# Patient Record
Sex: Female | Born: 1985 | Race: Black or African American | Hispanic: No | Marital: Single | Smoking: Current some day smoker
Health system: Southern US, Community
[De-identification: ages and names within clinical notes are randomized; demographics above are authoritative.]

---

## 2005-04-18 ENCOUNTER — Inpatient Hospital Stay (HOSPITAL_COMMUNITY): Admission: AD | Admit: 2005-04-18 | Discharge: 2005-04-18 | Payer: Self-pay | Admitting: *Deleted

## 2005-04-20 ENCOUNTER — Inpatient Hospital Stay (HOSPITAL_COMMUNITY): Admission: AD | Admit: 2005-04-20 | Discharge: 2005-04-20 | Payer: Self-pay | Admitting: Obstetrics & Gynecology

## 2005-05-04 ENCOUNTER — Inpatient Hospital Stay (HOSPITAL_COMMUNITY): Admission: AD | Admit: 2005-05-04 | Discharge: 2005-05-04 | Payer: Self-pay | Admitting: Obstetrics & Gynecology

## 2008-02-06 ENCOUNTER — Emergency Department (HOSPITAL_COMMUNITY): Admission: EM | Admit: 2008-02-06 | Discharge: 2008-02-06 | Payer: Self-pay | Admitting: Emergency Medicine

## 2010-01-09 ENCOUNTER — Emergency Department (HOSPITAL_COMMUNITY)
Admission: EM | Admit: 2010-01-09 | Discharge: 2010-01-09 | Payer: Self-pay | Source: Home / Self Care | Admitting: Emergency Medicine

## 2010-05-02 ENCOUNTER — Emergency Department (HOSPITAL_COMMUNITY)
Admission: EM | Admit: 2010-05-02 | Discharge: 2010-05-02 | Disposition: A | Discharge status: Home / Self Care | Payer: Self-pay | Attending: Emergency Medicine | Admitting: Emergency Medicine

## 2010-05-02 DIAGNOSIS — R0609 Other forms of dyspnea: Secondary | ICD-10-CM | POA: Insufficient documentation

## 2010-05-02 DIAGNOSIS — R5381 Other malaise: Secondary | ICD-10-CM | POA: Insufficient documentation

## 2010-05-02 DIAGNOSIS — R002 Palpitations: Secondary | ICD-10-CM | POA: Insufficient documentation

## 2010-05-02 DIAGNOSIS — R0989 Other specified symptoms and signs involving the circulatory and respiratory systems: Secondary | ICD-10-CM | POA: Insufficient documentation

## 2010-05-02 DIAGNOSIS — R454 Irritability and anger: Secondary | ICD-10-CM | POA: Insufficient documentation

## 2010-05-02 DIAGNOSIS — H5789 Other specified disorders of eye and adnexa: Secondary | ICD-10-CM | POA: Insufficient documentation

## 2010-05-02 DIAGNOSIS — F411 Generalized anxiety disorder: Secondary | ICD-10-CM | POA: Insufficient documentation

## 2010-05-02 DIAGNOSIS — R110 Nausea: Secondary | ICD-10-CM | POA: Insufficient documentation

## 2010-05-02 DIAGNOSIS — R5383 Other fatigue: Secondary | ICD-10-CM | POA: Insufficient documentation

## 2010-05-02 LAB — CBC
Hemoglobin: 12 g/dL (ref 12.0–15.0)
MCH: 28.2 pg (ref 26.0–34.0)
MCV: 86.4 fL (ref 78.0–100.0)
RBC: 4.25 MIL/uL (ref 3.87–5.11)

## 2010-05-02 LAB — COMPREHENSIVE METABOLIC PANEL
BUN: 7 mg/dL (ref 6–23)
CO2: 26 mEq/L (ref 19–32)
Chloride: 108 mEq/L (ref 96–112)
Creatinine, Ser: 0.9 mg/dL (ref 0.4–1.2)
GFR calc non Af Amer: >60 mL/min (ref >60)
Glucose, Bld: 94 mg/dL (ref 70–99)
Total Bilirubin: 0.4 mg/dL (ref 0.3–1.2)

## 2010-05-02 LAB — URINE MICROSCOPIC-ADD ON

## 2010-05-02 LAB — DIFFERENTIAL
Lymphs Abs: 3.9 10*3/uL (ref 0.7–4.0)
Monocytes Absolute: 0.4 10*3/uL (ref 0.1–1.0)
Monocytes Relative: 5 % (ref 3–12)
Neutro Abs: 5.3 10*3/uL (ref 1.7–7.7)
Neutrophils Relative %: 54 % (ref 43–77)

## 2010-05-02 LAB — URINALYSIS, ROUTINE W REFLEX MICROSCOPIC
Leukocytes, UA: NEGATIVE
Protein, ur: 30 mg/dL — AB
Specific Gravity, Urine: 1.031 — ABNORMAL HIGH (ref 1.005–1.030)
Urine Glucose, Fasting: NEGATIVE mg/dL
Urobilinogen, UA: 1 mg/dL (ref 0.0–1.0)

## 2010-10-16 ENCOUNTER — Emergency Department (HOSPITAL_COMMUNITY)
Admission: EM | Admit: 2010-10-16 | Discharge: 2010-10-16 | Disposition: A | Discharge status: Home / Self Care | Payer: Self-pay | Attending: Emergency Medicine | Admitting: Emergency Medicine

## 2010-10-16 DIAGNOSIS — R229 Localized swelling, mass and lump, unspecified: Secondary | ICD-10-CM | POA: Insufficient documentation

## 2010-10-16 DIAGNOSIS — M79609 Pain in unspecified limb: Secondary | ICD-10-CM | POA: Insufficient documentation

## 2010-10-16 DIAGNOSIS — L03019 Cellulitis of unspecified finger: Secondary | ICD-10-CM | POA: Insufficient documentation

## 2012-03-25 ENCOUNTER — Encounter (HOSPITAL_COMMUNITY): Payer: Self-pay | Admitting: Emergency Medicine

## 2012-03-25 ENCOUNTER — Emergency Department (HOSPITAL_COMMUNITY)
Admission: EM | Admit: 2012-03-25 | Discharge: 2012-03-25 | Disposition: A | Discharge status: Home / Self Care | Payer: Self-pay | Attending: Emergency Medicine | Admitting: Emergency Medicine

## 2012-03-25 DIAGNOSIS — R1310 Dysphagia, unspecified: Secondary | ICD-10-CM | POA: Insufficient documentation

## 2012-03-25 DIAGNOSIS — R5383 Other fatigue: Secondary | ICD-10-CM | POA: Insufficient documentation

## 2012-03-25 DIAGNOSIS — K0889 Other specified disorders of teeth and supporting structures: Secondary | ICD-10-CM

## 2012-03-25 DIAGNOSIS — R630 Anorexia: Secondary | ICD-10-CM | POA: Insufficient documentation

## 2012-03-25 DIAGNOSIS — L519 Erythema multiforme, unspecified: Secondary | ICD-10-CM | POA: Insufficient documentation

## 2012-03-25 DIAGNOSIS — K1370 Unspecified lesions of oral mucosa: Secondary | ICD-10-CM | POA: Insufficient documentation

## 2012-03-25 DIAGNOSIS — R5381 Other malaise: Secondary | ICD-10-CM | POA: Insufficient documentation

## 2012-03-25 DIAGNOSIS — R51 Headache: Secondary | ICD-10-CM | POA: Insufficient documentation

## 2012-03-25 DIAGNOSIS — H9209 Otalgia, unspecified ear: Secondary | ICD-10-CM | POA: Insufficient documentation

## 2012-03-25 DIAGNOSIS — K089 Disorder of teeth and supporting structures, unspecified: Secondary | ICD-10-CM | POA: Insufficient documentation

## 2012-03-25 MED ORDER — OXYCODONE-ACETAMINOPHEN 5-325 MG PO TABS: 1.0000 | ORAL_TABLET | ORAL | Status: DC | PRN

## 2012-03-25 MED ORDER — PENICILLIN V POTASSIUM 500 MG PO TABS: 1000.0000 mg | ORAL_TABLET | Freq: Two times a day (BID) | ORAL | Status: DC

## 2012-03-25 MED ORDER — DOCUSATE SODIUM 100 MG PO CAPS
100.0000 mg | ORAL_CAPSULE | Freq: Once | ORAL | Status: DC
  Filled 2012-03-25 (×2): qty 1

## 2012-03-25 NOTE — ED Notes (Signed)
Pt c/o bilateral dental pain x several months worse over last couple of days and ear pain x several days

## 2012-03-25 NOTE — ED Notes (Signed)
C/O right lower molar pain x 2 weeks. Also c/o right ear pain and right jaw.

## 2012-03-25 NOTE — ED Notes (Signed)
Pt refused colace in ear

## 2012-03-28 NOTE — ED Provider Notes (Signed)
History     CSN: [id number]  Arrival date & time 03/25/12  0825   First MD Initiated Contact with Patient 03/25/12 0840      Chief Complaint  Patient presents with  . Dental Pain  . Otalgia    (Consider location/radiation/quality/duration/timing/severity/associated sxs/prior treatment) Patient is a 27 y.o. female presenting with tooth pain and ear pain. The history is provided by the patient. No language interpreter was used.  Dental PainThe primary symptoms include mouth pain and headaches. Primary symptoms do not include dental injury, oral bleeding, oral lesions, fever, shortness of breath, sore throat, angioedema or cough. The symptoms began 3 to 5 days ago. The symptoms are worsening. The symptoms are new.  Additional symptoms include: dental sensitivity to temperature, gum swelling, gum tenderness, pain with swallowing and ear pain. Additional symptoms do not include: purulent gums, trismus, jaw pain, facial swelling, trouble swallowing, excessive salivation, dry mouth, taste disturbance, smell disturbance, drooling, hearing loss, nosebleeds, swollen glands, goiter and fatigue. Medical issues do not include: alcohol problem, smoking, chewing tobacco, immunosuppression, periodontal disease and cancer. Associated medical issues comments: filling is missing.   Otalgia Associated symptoms include headaches. Pertinent negatives include no hearing loss, no sore throat, no abdominal pain, no diarrhea, no vomiting, no cough and no rash.    History reviewed. No pertinent past medical history.  History reviewed. No pertinent past surgical history.  History reviewed. No pertinent family history.  History  Substance Use Topics  . Smoking status: Never Smoker   . Smokeless tobacco: Not on file  . Alcohol Use: Yes    OB History    Grav Para Term Preterm Abortions TAB SAB Ect Mult Living                  Review of Systems  Constitutional: Positive for activity change and appetite  change. Negative for fever, chills and fatigue.  HENT: Positive for ear pain and dental problem. Negative for hearing loss, nosebleeds, sore throat, facial swelling, drooling and trouble swallowing.   Respiratory: Negative for cough and shortness of breath.   Cardiovascular: Negative.  Negative for chest pain.  Gastrointestinal: Negative.  Negative for nausea, vomiting, abdominal pain, diarrhea and constipation.  Genitourinary: Negative for dysuria and hematuria.  Musculoskeletal: Negative for myalgias and arthralgias.  Skin: Negative for rash.  Neurological: Positive for headaches. Negative for numbness.  Psychiatric/Behavioral: Negative.   All other systems reviewed and are negative.    Allergies  Review of patient's allergies indicates no known allergies.  Home Medications   Current Outpatient Rx  Name  Route  Sig  Dispense  Refill  . OXYCODONE-ACETAMINOPHEN 5-325 MG PO TABS   Oral   Take 1-2 tablets by mouth every 4 (four) hours as needed for pain.   10 tablet   0   . PENICILLIN V POTASSIUM 500 MG PO TABS   Oral   Take 2 tablets (1,000 mg total) by mouth 2 (two) times daily. X 7 days   28 tablet   0     BP 136/87  Pulse 77  Temp 98.3 F (36.8 C) (Oral)  Resp 18  SpO2 97%  Physical Exam  Constitutional: She is oriented to person, place, and time. She appears well-developed and well-nourished. No distress.  HENT:  Head: Normocephalic and atraumatic. No trismus in the jaw.  Mouth/Throat: Uvula is midline. No uvula swelling. No posterior oropharyngeal edema or posterior oropharyngeal erythema.         Back left molar with large  hole . Surrounding gingival erythema. No discharge.   Eyes: Conjunctivae normal are normal. No scleral icterus.  Neck: Normal range of motion.  Cardiovascular: Normal rate, regular rhythm and normal heart sounds.  Exam reveals no gallop and no friction rub.   No murmur heard. Pulmonary/Chest: Effort normal and breath sounds normal. No  respiratory distress.  Abdominal: Soft. Bowel sounds are normal. She exhibits no distension and no mass. There is no tenderness. There is no guarding.  Neurological: She is alert and oriented to person, place, and time.  Skin: Skin is warm and dry. She is not diaphoretic.    ED Course  Procedures (including critical care time)  Labs Reviewed - No data to display No results found.   1. Tooth pain       MDM  Patient with toothache.  No gross abscess.  Exam unconcerning for Ludwig's angina or spread of infection.  Will treat with penicillin and pain medicine.  Urged patient to follow-up with dentist.           Arthor Captain, PA-C 03/28/12 2207

## 2012-03-29 NOTE — ED Provider Notes (Signed)
Medical screening examination/treatment/procedure(s) were performed by non-physician practitioner and as supervising physician I was immediately available for consultation/collaboration.   Dessiree Sze L Rache Klimaszewski, MD 03/29/12 0147 

## 2013-02-03 ENCOUNTER — Emergency Department (HOSPITAL_COMMUNITY)
Admission: EM | Admit: 2013-02-03 | Discharge: 2013-02-03 | Disposition: A | Discharge status: Home / Self Care | Payer: Self-pay | Attending: Emergency Medicine | Admitting: Emergency Medicine

## 2013-02-03 ENCOUNTER — Encounter (HOSPITAL_COMMUNITY): Payer: Self-pay | Admitting: Emergency Medicine

## 2013-02-03 DIAGNOSIS — N61 Mastitis without abscess: Secondary | ICD-10-CM | POA: Insufficient documentation

## 2013-02-03 DIAGNOSIS — N611 Abscess of the breast and nipple: Secondary | ICD-10-CM

## 2013-02-03 MED ORDER — CEPHALEXIN 500 MG PO CAPS: 1000.0000 mg | ORAL_CAPSULE | Freq: Two times a day (BID) | ORAL | Status: DC

## 2013-02-03 MED ORDER — HYDROCODONE-ACETAMINOPHEN 5-325 MG PO TABS: 1.0000 | ORAL_TABLET | ORAL | Status: DC | PRN

## 2013-02-03 MED ORDER — SULFAMETHOXAZOLE-TRIMETHOPRIM 800-160 MG PO TABS: 1.0000 | ORAL_TABLET | Freq: Two times a day (BID) | ORAL | Status: DC

## 2013-02-03 NOTE — ED Provider Notes (Signed)
CSN: [id number]     Arrival date & time 02/03/13  2032 History  This chart was scribed for Ruby Cola, PA working with Richardean Canal, MD by Quintella Reichert, ED Scribe. This patient was seen in room WTR8/WTR8 and the patient's care was started at 9:41 PM.   Chief Complaint  Patient presents with  . Recurrent Skin Infections    The history is provided by the patient. No language interpreter was used.    HPI Comments: Jenna Hudson is a 27 y.o. female who presents to the Emergency Department complaining of a recurrent painful lump between her breasts that she first noticed 4 days ago.  Pt states that for some time she has had a "little bump" in that area which appears intermittently x >1 year and which seems to be relieved by taking a hot shower and resolves spontaneously.  Her present episode began 4 days ago and she states it is in the exact same location as her previous symptoms.  This is her 2nd episode this year although it has never been as persistent or painful as at present.  Her current episode has not been relieved by a hot shower or by Motrin.  She states the area "came to a head" but did not drain.  She also complains of constant moderate-to-severe pain to the area that is worsened by movement and palpation.  She denies fever, rash to any other area, discharge from nipples, or pain to any other area of breasts.  She denies chronic medical conditions or regular medication usage.  She denies personal or family h/o breast cancer.  She has not had a mammogram and has never been seen by an oncologist.   History reviewed. No pertinent past medical history.  History reviewed. No pertinent past surgical history.  History reviewed. No pertinent family history.   History  Substance Use Topics  . Smoking status: Never Smoker   . Smokeless tobacco: Not on file  . Alcohol Use: Yes    OB History   Grav Para Term Preterm Abortions TAB SAB Ect Mult Living                   Review of Systems  All other systems reviewed and are negative.     Allergies  Review of patient's allergies indicates no known allergies.  Home Medications   Current Outpatient Rx  Name  Route  Sig  Dispense  Refill  . ibuprofen (ADVIL,MOTRIN) 600 MG tablet   Oral   Take 600 mg by mouth every 6 (six) hours as needed.          BP 134/80  Pulse 82  Temp(Src) 98.1 F (36.7 C) (Oral)  Resp 16  Wt 280 lb (127.007 kg)  SpO2 100%  LMP 01/10/2013  Physical Exam  Nursing note and vitals reviewed. Constitutional: She is oriented to person, place, and time. She appears well-developed and well-nourished. No distress.  HENT:  Head: Normocephalic and atraumatic.  Eyes:  Normal appearance  Neck: Normal range of motion.  Pulmonary/Chest: Effort normal.  3cm induration and erythema, upper inner quadrant right breast.  Very ttp.  Visualized with bedside US and there is a subq fluid collection.  No tenderness of sternum.  Musculoskeletal: Normal range of motion.  Neurological: She is alert and oriented to person, place, and time.  Psychiatric: She has a normal mood and affect. Her behavior is normal.    ED Course  Procedures (including critical care time) INCISION AND DRAINAGE  Performed by: Ruby Cola E Consent: Verbal consent obtained. Risks and benefits: risks, benefits and alternatives were discussed Type: abscess  Body area: right breaset  Anesthesia: local infiltration  Incision was made with a scalpel.  Local anesthetic: lidocaine 2% w/ epinephrine  Anesthetic total: 4 ml  Complexity: complex Blunt dissection to break up loculations  Drainage: purulent  Drainage amount: large  Packing material: none  Patient tolerance: Patient tolerated the procedure well with no immediate complications.     DIAGNOSTIC STUDIES: Oxygen Saturation is 100% on room air, normal by my interpretation.    COORDINATION OF CARE: 9:47 PM-Discussed treatment  plan which includes bedside US with pt at bedside and pt agreed to plan.    Labs Review Labs Reviewed - No data to display  Imaging Review No results found.  EKG Interpretation   None       MDM   1. Abscess of breast, right    27yo healthy F presents w/ R breast abscess w/ minimal surrounding cellulitis.  I&D'd.  D/c'd home w/ abx d/t cellulitis and proximity to sternum.  Return precautions discussed.  10:23 PM     I personally performed the services described in this documentation, which was scribed in my presence. The recorded information has been reviewed and is accurate.    Otilio Miu, PA-C 02/03/13 2225  Otilio Miu, PA-C 02/03/13 2226

## 2013-02-03 NOTE — ED Provider Notes (Signed)
Medical screening examination/treatment/procedure(s) were performed by non-physician practitioner and as supervising physician I was immediately available for consultation/collaboration.  EKG Interpretation   None         Richardean Canal, MD 02/03/13 2238

## 2013-02-03 NOTE — ED Notes (Signed)
Patient states that she has had a red lump in between her breasts for 2 -3 dyas

## 2014-03-11 ENCOUNTER — Emergency Department (HOSPITAL_COMMUNITY)
Admission: EM | Admit: 2014-03-11 | Discharge: 2014-03-11 | Disposition: A | Discharge status: Home / Self Care | Payer: Self-pay | Attending: Emergency Medicine | Admitting: Emergency Medicine

## 2014-03-11 ENCOUNTER — Encounter (HOSPITAL_COMMUNITY): Payer: Self-pay | Admitting: *Deleted

## 2014-03-11 DIAGNOSIS — Y9389 Activity, other specified: Secondary | ICD-10-CM | POA: Insufficient documentation

## 2014-03-11 DIAGNOSIS — Z792 Long term (current) use of antibiotics: Secondary | ICD-10-CM | POA: Insufficient documentation

## 2014-03-11 DIAGNOSIS — Y92481 Parking lot as the place of occurrence of the external cause: Secondary | ICD-10-CM | POA: Insufficient documentation

## 2014-03-11 DIAGNOSIS — Z041 Encounter for examination and observation following transport accident: Secondary | ICD-10-CM | POA: Insufficient documentation

## 2014-03-11 DIAGNOSIS — Z79899 Other long term (current) drug therapy: Secondary | ICD-10-CM | POA: Insufficient documentation

## 2014-03-11 DIAGNOSIS — Y998 Other external cause status: Secondary | ICD-10-CM | POA: Insufficient documentation
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
## 2014-03-11 NOTE — ED Notes (Signed)
pts vital signs updated pt awaiting discharge paperwork at bedside.  
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
## 2014-08-03 ENCOUNTER — Emergency Department (HOSPITAL_COMMUNITY): Payer: Self-pay

## 2014-08-03 ENCOUNTER — Emergency Department (HOSPITAL_COMMUNITY)
Admission: EM | Admit: 2014-08-03 | Discharge: 2014-08-03 | Disposition: A | Discharge status: Home / Self Care | Payer: Self-pay | Attending: Emergency Medicine | Admitting: Emergency Medicine

## 2014-08-03 ENCOUNTER — Encounter (HOSPITAL_COMMUNITY): Payer: Self-pay | Admitting: Emergency Medicine

## 2014-08-03 DIAGNOSIS — Z792 Long term (current) use of antibiotics: Secondary | ICD-10-CM | POA: Insufficient documentation

## 2014-08-03 DIAGNOSIS — M79671 Pain in right foot: Secondary | ICD-10-CM | POA: Insufficient documentation

## 2014-08-03 MED ORDER — IBUPROFEN 600 MG PO TABS: 600.0000 mg | ORAL_TABLET | Freq: Four times a day (QID) | ORAL | Status: AC | PRN

## 2014-08-03 NOTE — ED Notes (Signed)
Patient states R foot pain x 1 week.  Patient states "I have a knot on my pinky toe and a black dot that looks like a bite".   Patient denies injury to foot, unsure if actual insect bite. Denies other symptoms.

## 2014-08-03 NOTE — ED Provider Notes (Signed)
CSN: [id number]     Arrival date & time 08/03/14  [id number] History  This chart was scribed for Elson AreasLeslie K Jeslyn Amsler, PA-C, working with Mirian MoMatthew Gentry, MD by Octavia HeirArianna Nassar, ED Scribe. This patient was seen in room TR08C/TR08C and the patient's care was started at 10:00 AM.    Chief Complaint  Patient presents with  . Foot Pain    The history is provided by the patient. No language interpreter was used.   HPI Comments: Jenna Hudson is a 29 y.o. female who presents to the Emergency Department complaining of constant, gradual worsening right foot pain onset one week. She notes that pain is exacerbated when she bears weight to her foot. Patient denies any injury to her foot but states she stands about 12 hours a day at work. She has no known allergies.  Pt works at [employer] and get pineapple juice in her shoes. Pt reports feet and nails turn dark. Pt complains of pain to side of foot.  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  Pt reports a lot of people she works with have toe problems.    History reviewed. No pertinent past medical history. History reviewed. No pertinent past surgical history. No family history on file. History  Substance Use Topics  . Smoking status: Never Smoker   . Smokeless tobacco: Not on file  . Alcohol Use: Yes   OB History    No data available     Review of Systems  Musculoskeletal: Positive for arthralgias.  All other systems reviewed and are negative.  Allergies  Review of patient's allergies indicates no known allergies.  Home Medications   Prior to Admission medications   Medication Sig Start Date End Date Taking? Authorizing Provider  cephALEXin (KEFLEX) 500 MG capsule Take 2 capsules (1,000 mg total) by mouth 2 (two) times daily. 02/03/13   Ruby Colaatherine Schinlever, PA-C  HYDROcodone-acetaminophen (NORCO/VICODIN) 5-325 MG per tablet Take 1 tablet by mouth every 4 (four) hours as needed for moderate pain. 02/03/13   Catherine Schinlever, PA-C   ibuprofen (ADVIL,MOTRIN) 600 MG tablet Take 600 mg by mouth every 6 (six) hours as needed.    Historical Provider, MD  sulfamethoxazole-trimethoprim (SEPTRA DS) 800-160 MG per tablet Take 1 tablet by mouth 2 (two) times daily. 02/03/13   Ruby Colaatherine Schinlever, PA-C   Triage vitals: BP 143/96 mmHg  Pulse 71  Temp(Src) 97.5 F (36.4 C) (Oral)  Resp 22  SpO2 100% Physical Exam  Constitutional: She appears well-developed and well-nourished. No distress.  HENT:  Head: Normocephalic and atraumatic.  Eyes: Right eye exhibits no discharge. Left eye exhibits no discharge.  Cardiovascular: Normal rate.   Pulmonary/Chest: Effort normal. No respiratory distress.  Musculoskeletal: She exhibits tenderness.  Tender 5th metatarsal head Darkened area proximal 5th toenail  Neurological: She is alert. Coordination normal.  Skin: No rash noted. She is not diaphoretic.  Psychiatric: She has a normal mood and affect. Her behavior is normal.  Nursing note and vitals reviewed.   ED Course  Procedures   DIAGNOSTIC STUDIES: Oxygen Saturation is 100% on RA, normal by my interpretation.  COORDINATION OF CARE: 10:01 AM Will order imaging. Patient agrees.   Labs Review Labs Reviewed - No data to display  Imaging Review Dg Foot Complete Right  08/03/2014   CLINICAL DATA:  Pain along the lateral aspect of the right foot for 1 week.  EXAM: RIGHT FOOT COMPLETE - 3+ VIEW  COMPARISON:  None.  FINDINGS: There is  no evidence of fracture or dislocation. There is no evidence of arthropathy or other focal bone abnormality. Soft tissues are unremarkable.  IMPRESSION: Negative.   Electronically Signed   By: Charlett NoseKevin  Dover M.D.   On: 08/03/2014 10:36     EKG Interpretation None      MDM   Final diagnoses:  Foot pain, right    Post op shoe Ace Follow up with Dr. Ophelia CharterYates,  Wear waterproof footwear.    I personally performed the services in this documentation, which was scribed in my presence.  The  recorded information has been reviewed and considered.   Barnet PallKaren SofiaPAC.   Lonia SkinnerLeslie K TiptonSofia, PA-C 08/03/14 1619  Mirian MoMatthew Gentry, MD 08/07/14 [phone]

## 2014-08-03 NOTE — Discharge Instructions (Signed)
Fingernail or Toenail Loss All or part of your fingernail or toenail has been lost. This may or may not grow back as a normal nail. A special non-stick bandage has been put on your finger or toe tightly to prevent bleeding. HOME CARE INSTRUCTIONS  The tips of fingers and toes are full of nerves and injuries are often very painful. The following will help you decrease the pain and obtain the best outcome.  Keep your hand or foot elevated above your heart to relieve pain and swelling. This will require lying in bed or on a couch with the hand or leg on pillows or sitting in a recliner with the leg up. Letting your hand or leg dangle may increase swelling, slow healing and cause throbbing pain.  Keep your dressing dry and clean.  Change your bandage in 24 hours after going home.  After your bandage is changed, soak your hand or foot in warm soapy water for 10 to 20 minutes. Do this 3 times per day. This helps reduce pain and swelling. After soaking, apply a clean, dry bandage. Change your bandage if it is wet or dirty.  Only take over-the-counter or prescription medicines for pain, discomfort, or fever as directed by your caregiver.  See your caregiver as needed for problems. SEEK IMMEDIATE MEDICAL CARE IF:   You have increased pain, swelling, drainage, or bleeding.  You have a fever. MAKE SURE YOU:   Understand these instructions.  Will watch your condition.  Will get help right away if you are not doing well or get worse. Document Released: 01/26/2006 Document Revised: 05/29/2011 Document Reviewed: 04/17/2006 [facility] Patient Information 2015 [place], [place]. This information is not intended to replace advice given to you by your health care provider. Make sure you discuss any questions you have with your health care provider. Foot Contusion A foot contusion is a deep bruise to the foot. Contusions are the result of an injury that caused bleeding under the skin. The contusion may turn  blue, purple, or yellow. Minor injuries will give you a painless contusion, but more severe contusions may stay painful and swollen for a few weeks. CAUSES  A foot contusion comes from a direct blow to that area, such as a heavy object falling on the foot. SYMPTOMS   Swelling of the foot.  Discoloration of the foot.  Tenderness or soreness of the foot. DIAGNOSIS  You will have a physical exam and will be asked about your history. You may need an X-ray of your foot to look for a broken bone (fracture).  TREATMENT  An elastic wrap may be recommended to support your foot. Resting, elevating, and applying cold compresses to your foot are often the best treatments for a foot contusion. Over-the-counter medicines may also be recommended for pain control. HOME CARE INSTRUCTIONS   Put ice on the injured area.  Put ice in a plastic bag.  Place a towel between your skin and the bag.  Leave the ice on for 15-20 minutes, 03-04 times a day.  Only take over-the-counter or prescription medicines for pain, discomfort, or fever as directed by your caregiver.  If told, use an elastic wrap as directed. This can help reduce swelling. You may remove the wrap for sleeping, showering, and bathing. If your toes become numb, cold, or blue, take the wrap off and reapply it more loosely.  Elevate your foot with pillows to reduce swelling.  Try to avoid standing or walking while the foot is painful. Do not  resume use until instructed by your caregiver. Then, begin use gradually. If pain develops, decrease use. Gradually increase activities that do not cause discomfort until you have normal use of your foot.  See your caregiver as directed. It is very important to keep all follow-up appointments in order to avoid any lasting problems with your foot, including long-term (chronic) pain. SEEK IMMEDIATE MEDICAL CARE IF:   You have increased redness, swelling, or pain in your foot.  Your swelling or pain is not  relieved with medicines.  You have loss of feeling in your foot or are unable to move your toes.  Your foot turns cold or blue.  You have pain when you move your toes.  Your foot becomes warm to the touch.  Your contusion does not improve in 2 days. MAKE SURE YOU:   Understand these instructions.  Will watch your condition.  Will get help right away if you are not doing well or get worse. Document Released: 12/26/2005 Document Revised: 09/05/2011 Document Reviewed: 02/07/2011 [facility]ExitCare Patient Information 2015 [place]ExitCare, [place]LL[place]. This information is not intended to replace advice given to you by your health care provider. Make sure you discuss any questions you have with your health care provider.

## 2014-08-03 NOTE — ED Notes (Signed)
Declined W/C at D/C and was escorted to lobby by RN. 

## 2016-01-28 ENCOUNTER — Emergency Department (HOSPITAL_COMMUNITY)
Admission: EM | Admit: 2016-01-28 | Discharge: 2016-01-28 | Disposition: A | Discharge status: Home / Self Care | Payer: Self-pay | Attending: Emergency Medicine | Admitting: Emergency Medicine

## 2016-01-28 ENCOUNTER — Encounter (HOSPITAL_COMMUNITY): Payer: Self-pay | Admitting: Emergency Medicine

## 2016-01-28 DIAGNOSIS — F17200 Nicotine dependence, unspecified, uncomplicated: Secondary | ICD-10-CM | POA: Insufficient documentation

## 2016-01-28 DIAGNOSIS — L02412 Cutaneous abscess of left axilla: Secondary | ICD-10-CM | POA: Insufficient documentation

## 2016-01-28 MED ORDER — SULFAMETHOXAZOLE-TRIMETHOPRIM 800-160 MG PO TABS: 1.0000 | ORAL_TABLET | Freq: Two times a day (BID) | ORAL | 0 refills | Status: DC

## 2016-01-28 NOTE — Discharge Instructions (Signed)
Medications: Bactrim  Treatment: Take Bactrim twice daily as prescribed for 1 week. Use warm compresses 3-4 times daily alternating 10 minutes on, 10 minutes off.  Follow-up: Please return to emergency department if your symptoms are not improving or if they are worsening, including increasing size of the area, increasing pain, swelling, streaking from the area.

## 2016-01-28 NOTE — ED Notes (Signed)
[facility] COUPON 98 GIVEN

## 2016-01-28 NOTE — ED Triage Notes (Signed)
Abscess in left axilla x 1 week. States "It has gone down" and that it does not really tender to touch. States it seems to get irritated with movement, such as at work. Pt has small, non-draining nodule left axilla.

## 2016-01-28 NOTE — ED Provider Notes (Signed)
[facility] DEPT Provider Note   CSN: [id number] Arrival date & time: 01/28/16  1041  By signing my name below, I, Placido SouLogan Joldersma, attest that this documentation has been prepared under the direction and in the presence of Emerson Electriclexandra Rihaan Barrack, PA-C.  Electronically Signed: Placido SouLogan Joldersma, ED Scribe. 01/28/16. 11:30 AM.    History   Chief Complaint Chief Complaint  Patient presents with  . Abscess    LEFT AXILLA    HPI HPI Comments: Rosalyn ChartersShamarell N Glasscock is a 30 y.o. female who presents to the Emergency Department complaining of a gradually alleviating point of pain and swelling to her left axilla x 1 week. She reports a h/o abscesses in the region and has had I&Ds performed which provide relief. She states her pain and swelling has gradually alleviated to "just a knot". Pt has not taken anything for her symptoms. Pt has no known allergies. She denies drainage, redness, fevers, chills, CP, SOB, abdominal pain, nausea, vomiting or urinary symptoms. LMP began yesterday.   PCP: None   The history is provided by the patient. No language interpreter was used.    History reviewed. No pertinent past medical history.  There are no active problems to display for this patient.   History reviewed. No pertinent surgical history.  OB History    No data available       Home Medications    Prior to Admission medications   Medication Sig Start Date End Date Taking? Authorizing Provider  cephALEXin (KEFLEX) 500 MG capsule Take 2 capsules (1,000 mg total) by mouth 2 (two) times daily. 02/03/13   Ruby Colaatherine Schinlever, PA-C  HYDROcodone-acetaminophen (NORCO/VICODIN) 5-325 MG per tablet Take 1 tablet by mouth every 4 (four) hours as needed for moderate pain. 02/03/13   Catherine Schinlever, PA-C  ibuprofen (ADVIL,MOTRIN) 600 MG tablet Take 1 tablet (600 mg total) by mouth every 6 (six) hours as needed. 08/03/14   Elson AreasLeslie K Sofia, PA-C  sulfamethoxazole-trimethoprim (SEPTRA DS) 800-160 MG tablet  Take 1 tablet by mouth 2 (two) times daily. 01/28/16   Emi HolesAlexandra M Carylon Tamburro, PA-C    Family History No family history on file.  Social History Social History  Substance Use Topics  . Smoking status: Current Some Day Smoker  . Smokeless tobacco: Never Used  . Alcohol use Yes     Allergies   Patient has no known allergies.   Review of Systems Review of Systems  Constitutional: Negative for chills and fever.  Respiratory: Negative for shortness of breath.   Cardiovascular: Negative for chest pain.  Gastrointestinal: Negative for abdominal pain, nausea and vomiting.  Genitourinary: Negative for difficulty urinating, dysuria, frequency and hematuria.  Skin: Negative for color change.   Physical Exam Updated Vital Signs BP 151/81 (BP Location: Right Arm)   Pulse 72   Temp 98.4 F (36.9 C) (Oral)   Resp 16   SpO2 100%   Physical Exam  Constitutional: She appears well-developed and well-nourished. No distress.  HENT:  Head: Normocephalic and atraumatic.  Mouth/Throat: Oropharynx is clear and moist. No oropharyngeal exudate.  Eyes: Conjunctivae are normal. Pupils are equal, round, and reactive to light. Right eye exhibits no discharge. Left eye exhibits no discharge. No scleral icterus.  Neck: Normal range of motion. Neck supple. No thyromegaly present.  Cardiovascular: Normal rate, regular rhythm, normal heart sounds and intact distal pulses.  Exam reveals no gallop and no friction rub.   No murmur heard. Pulmonary/Chest: Effort normal and breath sounds normal. No stridor. No respiratory distress. She has  no wheezes. She has no rales.  Abdominal: Soft. Bowel sounds are normal. She exhibits no distension. There is no tenderness. There is no rebound and no guarding.  Musculoskeletal: She exhibits no edema.  Lymphadenopathy:    She has no cervical adenopathy.  Neurological: She is alert. Coordination normal.  Skin: Skin is warm and dry. No rash noted. She is not diaphoretic. No  pallor.  <1cm area of induration to the left axilla, mobile, mildly tender  Psychiatric: She has a normal mood and affect.  Nursing note and vitals reviewed.  ED Treatments / Results  Labs (all labs ordered are listed, but only abnormal results are displayed) Labs Reviewed - No data to display  EKG  EKG Interpretation None       Radiology No results found.  Procedures Procedures  DIAGNOSTIC STUDIES: Oxygen Saturation is 100% on RA, normal by my interpretation.    COORDINATION OF CARE: 11:25 AM Discussed next steps with pt. Pt verbalized understanding and is agreeable with the plan.    EMERGENCY DEPARTMENT US SOFT TISSUE INTERPRETATION "Study: Limited Ultrasound of the noted body part in comments below"  INDICATIONS: Soft tissue infection Multiple views of the body part are obtained with a multi-frequency linear probe  PERFORMED BY:  Myself  IMAGES ARCHIVED?: Yes  SIDE:Left  BODY PART:Axilla  FINDINGS: Abcess present  LIMITATIONS: None  INTERPRETATION:  Abcess present (84mmTimoTheora GianoElEng15minTimoTheora GianoElEng15minTimoTheora GianoElEng78minTimoTheora GianoElEng62minTimoTheora GianoElEng82minTimoTheora GianoElEng69minTimoTheora GianoElEng63minTimoTheora GianoElEng51minTimoTheora GianoElEng25minTimoTheora GianoElEng61minTimoTheora GianoElEng81minTimoTheora GianoElEng73minTimoTheora GianoElEng51minTimoTheora GianoElEng79minTimoTheora GianoElEng18minTimoTheora GianoElEng32minTimoTheora GianoElEng19minTimoTheora GianoElEng12minTimoTheora GianoElEng36minTimoTheora GianoElEng47mTheora GianoElEngine6merTimoTheora GianoElEng56minTimoTheora GianoElEng54minTimoTheora GianoElEng71minTimoTheora GianoElEng67minTimoTheora GianoElEng26minTimoTheora GianoElEng109minTimoTheora GianoElEng11minTimoTheora GianoElEng42minTimoTheora GianoElEng62minTimoTheora GianoElEng3minTimoTheora GianoElEng50minTimoTheora GianoElEngineerConard NovakMedications Ordered in ED Medications - No data to display  Initial Impression / Assessment and Plan / ED Course  I have reviewed the triage vital signs and the nursing notes.  Pertinent labs & imaging results that were available during my care of the patient were reviewed by me and considered in my medical decision making (see chart for details).  Clinical Course     Performed a soft tissue US of the left axilla showing a 0.5 cm region of Left axilla. Pt requests that I not perform an I&D and based on US findings feel that d/c with Bactrim is appropriate considering no fluctuance felt, and per history, abscess seems to be resolving. Patient also advised to use warm compresses 3-4 times daily. Patient is currently in her menstruation cycle and denies pregnancy. Pt given strict return precautions, verbalized understanding and agreement to today's plan and had no further  questions or concerns at the time of discharge.  Patient vitals stable and discharged in satisfactory condition.  I personally performed the services described in this documentation, which was scribed in my presence. The recorded information has been reviewed and is accurate.  Final Clinical Impressions(s) / ED Diagnoses   Final diagnoses:  Abscess of axilla, left    New Prescriptions Current Discharge Medication List       Precious Segall M Konnor Jorden, PA-C 01/28/16 1140    Rachel Morgan Little, MD 01/29/16 0711

## 2016-04-24 ENCOUNTER — Emergency Department (HOSPITAL_COMMUNITY)
Admission: EM | Admit: 2016-04-24 | Discharge: 2016-04-24 | Disposition: A | Discharge status: Home / Self Care | Payer: Self-pay | Attending: Emergency Medicine | Admitting: Emergency Medicine

## 2016-04-24 ENCOUNTER — Encounter (HOSPITAL_COMMUNITY): Payer: Self-pay | Admitting: Emergency Medicine

## 2016-04-24 DIAGNOSIS — B351 Tinea unguium: Secondary | ICD-10-CM | POA: Insufficient documentation

## 2016-04-24 DIAGNOSIS — F17200 Nicotine dependence, unspecified, uncomplicated: Secondary | ICD-10-CM | POA: Insufficient documentation

## 2016-04-24 NOTE — ED Provider Notes (Signed)
[facility] Provider Note   CSN: [id number] Arrival date & time: 04/24/16  0557     History   Chief Complaint Chief Complaint  Patient presents with  . Nail Problem    HPI Jenna Hudson is a 31 y.o. female who presents with chief complaint of toenail fungus. 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 He has had sweaty feet. She's been trying to change the tape sock. She's been wearing. She denies any foot, itching, toenail pain.  HPI  History reviewed. No pertinent past medical history.  There are no active problems to display for this patient.   History reviewed. No pertinent surgical history.  OB History    No data available       Home Medications    Prior to Admission medications   Medication Sig Start Date End Date Taking? Authorizing Provider  cephALEXin (KEFLEX) 500 MG capsule Take 2 capsules (1,000 mg total) by mouth 2 (two) times daily. 02/03/13   Ruby Colaatherine Schinlever, PA-C  HYDROcodone-acetaminophen (NORCO/VICODIN) 5-325 MG per tablet Take 1 tablet by mouth every 4 (four) hours as needed for moderate pain. 02/03/13   Catherine Schinlever, PA-C  ibuprofen (ADVIL,MOTRIN) 600 MG tablet Take 1 tablet (600 mg total) by mouth every 6 (six) hours as needed. 08/03/14   Elson AreasLeslie K Sofia, PA-C  sulfamethoxazole-trimethoprim (SEPTRA DS) 800-160 MG tablet Take 1 tablet by mouth 2 (two) times daily. 01/28/16   Emi HolesAlexandra M Law, PA-C    Family History History reviewed. No pertinent family history.  Social History Social History  Substance Use Topics  . Smoking status: Current Some Day Smoker  . Smokeless tobacco: Never Used  . Alcohol use Yes     Allergies   Patient has no known allergies.   Review of Systems Review of Systems Negative for fevers, chills Positive for toenail peeling, yellowing.  Physical Exam Updated Vital Signs BP 121/96 (BP Location: Right Arm)   Pulse 66   Temp 98.3 F (36.8 C) (Oral)   Resp 14   Ht 5\' 11"  (1.803 m)    Wt 124.7 kg   LMP 04/24/2016 (Exact Date)   SpO2 99%   BMI 38.35 kg/m   Physical Exam  Constitutional: She is oriented to person, place, and time. She appears well-developed and well-nourished. No distress.  HENT:  Head: Normocephalic and atraumatic.  Eyes: Conjunctivae are normal. No scleral icterus.  Neck: Normal range of motion.  Cardiovascular: Normal rate, regular rhythm and normal heart sounds.  Exam reveals no gallop and no friction rub.   No murmur heard. Pulmonary/Chest: Effort normal and breath sounds normal. No respiratory distress.  Abdominal: Soft. Bowel sounds are normal. She exhibits no distension and no mass. There is no tenderness. There is no guarding.  Neurological: She is alert and oriented to person, place, and time.  Skin: Skin is warm and dry. She is not diaphoretic.  Bilateral great toenail fungal infection, yellowing, peeling, some non-traumatic avulsion of the distal toenail from the nail bed.     ED Treatments / Results  Labs (all labs ordered are listed, but only abnormal results are displayed) Labs Reviewed - No data to display  EKG  EKG Interpretation None       Radiology No results found.  Procedures Procedures (including critical care time)  Medications Ordered in ED Medications - No data to display   Initial Impression / Assessment and Plan / ED Course  I have reviewed the triage vital signs and the nursing notes.  Pertinent labs & imaging results that were available during my care of the patient were reviewed by me and considered in my medical decision making (see chart for details).    Patient advised to use over-the-counter medications, keep feet dry, be persistent with the course of treatment, as it may take very long time. She's been given referral to a podiatrist. Discussed return precautions. Pierce safe for discharge at this time  Final Clinical Impressions(s) / ED Diagnoses   Final diagnoses:  Toenail fungus    New  Prescriptions New Prescriptions   No medications on file     Arthor Captain, PA-C 04/24/16 4098    Dione Booze, MD 04/25/16 [phone]

## 2016-04-24 NOTE — ED Triage Notes (Signed)
Pt c/o toe nail fungus to both great toes that has caused the nail to raise off the bed.

## 2016-04-24 NOTE — ED Notes (Signed)
Pt departed in NAD.  

## 2017-05-14 ENCOUNTER — Emergency Department (HOSPITAL_COMMUNITY): Payer: Self-pay

## 2017-05-14 ENCOUNTER — Encounter (HOSPITAL_COMMUNITY): Payer: Self-pay

## 2017-05-14 ENCOUNTER — Emergency Department (HOSPITAL_COMMUNITY)
Admission: EM | Admit: 2017-05-14 | Discharge: 2017-05-14 | Disposition: A | Discharge status: Home / Self Care | Payer: Self-pay | Attending: Emergency Medicine | Admitting: Emergency Medicine

## 2017-05-14 ENCOUNTER — Other Ambulatory Visit: Payer: Self-pay

## 2017-05-14 DIAGNOSIS — R030 Elevated blood-pressure reading, without diagnosis of hypertension: Secondary | ICD-10-CM

## 2017-05-14 DIAGNOSIS — J069 Acute upper respiratory infection, unspecified: Secondary | ICD-10-CM | POA: Insufficient documentation

## 2017-05-14 DIAGNOSIS — B9789 Other viral agents as the cause of diseases classified elsewhere: Secondary | ICD-10-CM | POA: Insufficient documentation

## 2017-05-14 DIAGNOSIS — F17200 Nicotine dependence, unspecified, uncomplicated: Secondary | ICD-10-CM | POA: Insufficient documentation

## 2017-05-14 LAB — RAPID STREP SCREEN (MED CTR MEBANE ONLY): STREPTOCOCCUS, GROUP A SCREEN (DIRECT): NEGATIVE

## 2017-05-14 MED ORDER — ALBUTEROL SULFATE HFA 108 (90 BASE) MCG/ACT IN AERS: 1.0000 | INHALATION_SPRAY | Freq: Four times a day (QID) | RESPIRATORY_TRACT | 0 refills | Status: DC | PRN

## 2017-05-14 NOTE — ED Triage Notes (Signed)
Pt states she has had a cough X1 week. Pt states she also has right ear pain. Reports sore throat and chest tightness when coughing.

## 2017-05-14 NOTE — ED Provider Notes (Signed)
[facility] EMERGENCY DEPARTMENT Provider Note   CSN: [id number] Arrival date & time: 05/14/17  1324     History   Chief Complaint Chief Complaint  Patient presents with  . Cough  . Otalgia    HPI Jenna Hudson is a 32 y.o. female with no past medical history is here for evaluation of productive cough, sore throat, congestion, wheezing, subjective fevers and chills, generalized body aches for 5 days.  Also has bilateral ear pain, intermittently. Several of her coworkers have been sent home sick last week. Her daughter who goes to school states many of her classmates have been vomiting in class and had to be taken home. She smokes black milds 1-2 times a week. She denies any exertional chest pain, shortness of breath, abdominal pain, nausea, vomiting, diarrhea or constipation, urinary symptoms. No history of lung disease or immunosuppression. Has tried mucinex with minimal relief. No aggravating factors.   HPI  History reviewed. No pertinent past medical history.  There are no active problems to display for this patient.   History reviewed. No pertinent surgical history.  OB History    No data available       Home Medications    Prior to Admission medications   Medication Sig Start Date End Date Taking? Authorizing Provider  albuterol (PROVENTIL HFA;VENTOLIN HFA) 108 (90 Base) MCG/ACT inhaler Inhale 1-2 puffs into the lungs every 6 (six) hours as needed for wheezing or shortness of breath. 05/14/17   Liberty Handy, PA-C  cephALEXin (KEFLEX) 500 MG capsule Take 2 capsules (1,000 mg total) by mouth 2 (two) times daily. 02/03/13   Schinlever, Santina Evans, PA-C  HYDROcodone-acetaminophen (NORCO/VICODIN) 5-325 MG per tablet Take 1 tablet by mouth every 4 (four) hours as needed for moderate pain. 02/03/13   Schinlever, Santina Evans, PA-C  ibuprofen (ADVIL,MOTRIN) 600 MG tablet Take 1 tablet (600 mg total) by mouth every 6 (six) hours as needed. 08/03/14    Elson Areas, PA-C  sulfamethoxazole-trimethoprim (SEPTRA DS) 800-160 MG tablet Take 1 tablet by mouth 2 (two) times daily. 01/28/16   Emi Holes, PA-C    Family History History reviewed. No pertinent family history.  Social History Social History   Tobacco Use  . Smoking status: Current Some Day Smoker  . Smokeless tobacco: Never Used  Substance Use Topics  . Alcohol use: Yes  . Drug use: No     Allergies   Patient has no known allergies.   Review of Systems Review of Systems  Constitutional: Positive for chills and fever (subjective).  HENT: Positive for congestion, nosebleeds, postnasal drip, rhinorrhea and sore throat.   Eyes: Positive for pain.  Respiratory: Positive for cough, chest tightness and wheezing.   Musculoskeletal: Positive for myalgias.  All other systems reviewed and are negative.    Physical Exam Updated Vital Signs BP (!) 156/114 (BP Location: Left Arm)   Pulse 64   Temp 98 F (36.7 C) (Oral)   Resp 18   Ht 5\' 11"  (1.803 m)   Wt 127 kg (280 lb)   LMP 05/14/2017   SpO2 97%   BMI 39.05 kg/m   Physical Exam  Constitutional: She is oriented to person, place, and time. She appears well-developed and well-nourished. No distress.  NAD.  HENT:  Head: Normocephalic and atraumatic.  Right Ear: External ear normal.  Left Ear: External ear normal.  Mild mucosa edema, no rhinorrhea.  Oropharynx and tonsils erythematous. No tonsillar hypertrophy, asymmetry, exudates. Uvula midline. No trismus. MMM.  TM normal bilaterally.   Eyes: Conjunctivae and EOM are normal. No scleral icterus.  Neck: Normal range of motion.  No cervical adenopathy   Cardiovascular: Normal rate, regular rhythm and normal heart sounds.  No murmur heard. Pulmonary/Chest: Effort normal. She has wheezes.  Faint expiratory wheezing to upper and middle lobes, cleared with cough.   Abdominal: Soft. There is no tenderness.  Musculoskeletal: Normal range of motion. She  exhibits no deformity.  Neurological: She is alert and oriented to person, place, and time.  Skin: Skin is warm and dry. Capillary refill takes less than 2 seconds.  Psychiatric: She has a normal mood and affect. Her behavior is normal. Judgment and thought content normal.  Nursing note and vitals reviewed.    ED Treatments / Results  Labs (all labs ordered are listed, but only abnormal results are displayed) Labs Reviewed  RAPID STREP SCREEN (NOT [facility])  CULTURE, GROUP A STREP [facility])    EKG  EKG Interpretation None       Radiology Dg Chest 2 View  Result Date: 05/14/2017 CLINICAL DATA:  Cough, sore throat, and chest tightness. EXAM: CHEST  2 VIEW COMPARISON:  None. FINDINGS: The heart size and mediastinal contours are within normal limits. Both lungs are clear. The visualized skeletal structures are unremarkable. IMPRESSION: No active cardiopulmonary disease. Electronically Signed   By: Obie DredgeWilliam T Derry M.D.   On: 05/14/2017 08:56    Procedures Procedures (including critical care time)  Medications Ordered in ED Medications - No data to display   Initial Impression / Assessment and Plan / ED Course  I have reviewed the triage vital signs and the nursing notes.  Pertinent labs & imaging results that were available during my care of the patient were reviewed by me and considered in my medical decision making (see chart for details).    32 y.o. -year-old female with URI like symptoms  5 days. Known sick contacts. On my exam patient is nontoxic appearing, speaking in full sentences, w/o increased WOB. No fever, tachypnea, tachycardia, hypoxia. Only faint expiratory wheezing cleared with forceful cough. CXR and strep negative. No significant h/o immunocompromise. Given reassuring physical exam, will discharge with symptomatic treatment. Strict ED return precautions given. Patient is aware that a viral URI infection may precede pneumonia or worsening illness. Patient is aware  of red flag symptoms to monitor for that would warrant return to the ED for further reevaluation. She was hypertensive in ED which improved however I discussed this with pt and told her to check BP once daily to keep trend, she is to f/u with PCP if persitsently elevated. Has no h/o of same, is asymptomatic. Gave return precautions for hypertensive complications. She is agreeable. Will defer tx at this time as she is asymptomatic, young and low risk.   Final Clinical Impressions(s) / ED Diagnoses   Final diagnoses:  Viral upper respiratory tract infection with cough  Elevated blood pressure reading    ED Discharge Orders        Ordered    albuterol (PROVENTIL HFA;VENTOLIN HFA) 108 (90 Base) MCG/ACT inhaler  Every 6 hours PRN     05/14/17 0959       Liberty HandyGibbons, Claudia J, PA-C 05/14/17 1015    Azalia Bilisampos, Kevin, MD 05/14/17 1024

## 2017-05-14 NOTE — Discharge Instructions (Signed)
Your symptoms are most consistent with a viral upper respiratory infection, possibly influenza. You do not require specific medication for this. Your rapid strep test was negative. Your chest x-ray was normal without pneumonia. Your blood pressure was elevated today. This may be from current illness, stress, pain. Monitor your blood pressure at least once daily. If it is persistently greater than 140/90 you should follow up with a general doctor for medications for blood pressure.   The main treatment approach for a viral upper respiratory infection and/or influenza is to treat the symptoms, support your immune system and prevent spread of illness. Stay well-hydrated. Rest. Take ibuprofen or Tylenol around the clock to help with associated fevers, sore throat, headaches, generalized body aches and malaise. Use an over-the-counter nasal steroid spray (like Flonase) to help with nasal congestion, runny nose and postnasal drip.  Wash your hands often to prevent spread.  You had very mild wheezing, use albuterol inhaler for wheezing. Take an over the counter cough medication (mucinex) to help cough up mucus. At night time you can take a cough suppressant.   A viral upper respiratory infection and/or influenza typically lasts 7-10 days.  Symptoms resolve slowly.  However, a viral upper respiratory infection can also worsen and progress into pneumonia.  Monitor your symptoms. If your symptoms worsen, persist and you develop persistent fevers, chest pain, productive cough you should follow up with your primary care provider.

## 2017-05-16 LAB — CULTURE, GROUP A STREP (THRC)

## 2017-06-21 ENCOUNTER — Emergency Department (HOSPITAL_COMMUNITY): Payer: Self-pay

## 2017-06-21 ENCOUNTER — Encounter (HOSPITAL_COMMUNITY): Payer: Self-pay | Admitting: Emergency Medicine

## 2017-06-21 ENCOUNTER — Emergency Department (HOSPITAL_COMMUNITY)
Admission: EM | Admit: 2017-06-21 | Discharge: 2017-06-22 | Disposition: A | Discharge status: Home / Self Care | Payer: Self-pay | Attending: Emergency Medicine | Admitting: Emergency Medicine

## 2017-06-21 DIAGNOSIS — F17210 Nicotine dependence, cigarettes, uncomplicated: Secondary | ICD-10-CM | POA: Insufficient documentation

## 2017-06-21 DIAGNOSIS — J4541 Moderate persistent asthma with (acute) exacerbation: Secondary | ICD-10-CM | POA: Insufficient documentation

## 2017-06-21 DIAGNOSIS — Z79899 Other long term (current) drug therapy: Secondary | ICD-10-CM | POA: Insufficient documentation

## 2017-06-21 DIAGNOSIS — J209 Acute bronchitis, unspecified: Secondary | ICD-10-CM | POA: Insufficient documentation

## 2017-06-21 DIAGNOSIS — J40 Bronchitis, not specified as acute or chronic: Secondary | ICD-10-CM

## 2017-06-21 MED ORDER — ALBUTEROL SULFATE (2.5 MG/3ML) 0.083% IN NEBU
5.0000 mg | INHALATION_SOLUTION | Freq: Once | RESPIRATORY_TRACT | Status: AC
  Administered 2017-06-21: 5 mg via RESPIRATORY_TRACT
  Filled 2017-06-21: qty 6

## 2017-06-21 MED ORDER — PREDNISONE 20 MG PO TABS
60.0000 mg | ORAL_TABLET | Freq: Every day | ORAL | Status: DC
  Administered 2017-06-21: 60 mg via ORAL
  Filled 2017-06-21: qty 3

## 2017-06-21 MED ORDER — AZITHROMYCIN 250 MG PO TABS: 250.0000 mg | ORAL_TABLET | Freq: Every day | ORAL | 0 refills | Status: DC

## 2017-06-21 MED ORDER — ALBUTEROL SULFATE HFA 108 (90 BASE) MCG/ACT IN AERS
2.0000 | INHALATION_SPRAY | RESPIRATORY_TRACT | Status: DC
  Filled 2017-06-21: qty 6.7

## 2017-06-21 MED ORDER — AZITHROMYCIN 250 MG PO TABS
500.0000 mg | ORAL_TABLET | Freq: Once | ORAL | Status: AC
  Administered 2017-06-21: 500 mg via ORAL
  Filled 2017-06-21: qty 2

## 2017-06-21 MED ORDER — PREDNISONE 10 MG PO TABS: ORAL_TABLET | ORAL | 0 refills | Status: DC

## 2017-06-21 NOTE — ED Notes (Signed)
Pt called to be taken to triage room x 1 with no answer

## 2017-06-21 NOTE — ED Triage Notes (Signed)
Pt from home with c/o productive cough that began on Wednesday. Pt states. Her daughter was sick with similar symptoms. Pt has lower lobe bilateral wheezes. Pt also has low grade fever. Pt O2 sat ranged on RA from 90-94%.

## 2017-06-22 NOTE — ED Notes (Signed)
Pt verbalized understanding of follow-up and this last high BP reading.

## 2017-07-05 NOTE — ED Provider Notes (Signed)
[facility]-EMERGENCY DEPT Provider Note   CSN: [id number] Arrival date & time: 06/21/17  1907     History   Chief Complaint Chief Complaint  Patient presents with  . Cough    HPI Jenna Hudson is a 32 y.o. female.  The history is provided by the patient. No language interpreter was used.  Cough  This is a new problem. The current episode started yesterday. The problem occurs constantly. The problem has been gradually worsening. The cough is productive of sputum. There has been no fever. Pertinent negatives include no chest pain. She has tried nothing for the symptoms. She is not a smoker. Her past medical history does not include pneumonia.  Pt complains of a cough and congestion.  Pt reports she was short of breath tonight.   History reviewed. No pertinent past medical history.  There are no active problems to display for this patient.   History reviewed. No pertinent surgical history.   OB History   None      Home Medications    Prior to Admission medications   Medication Sig Start Date End Date Taking? Authorizing Provider  albuterol (PROVENTIL HFA;VENTOLIN HFA) 108 (90 Base) MCG/ACT inhaler Inhale 1-2 puffs into the lungs every 6 (six) hours as needed for wheezing or shortness of breath. 05/14/17   Liberty Handy, PA-C  azithromycin (ZITHROMAX) 250 MG tablet Take 1 tablet (250 mg total) by mouth daily. Take first 2 tablets together, then 1 every day until finished. 06/21/17   Elson Areas, PA-C  cephALEXin (KEFLEX) 500 MG capsule Take 2 capsules (1,000 mg total) by mouth 2 (two) times daily. 02/03/13   Schinlever, Santina Evans, PA-C  HYDROcodone-acetaminophen (NORCO/VICODIN) 5-325 MG per tablet Take 1 tablet by mouth every 4 (four) hours as needed for moderate pain. 02/03/13   Schinlever, Santina Evans, PA-C  ibuprofen (ADVIL,MOTRIN) 600 MG tablet Take 1 tablet (600 mg total) by mouth every 6 (six) hours as needed. 08/03/14   Elson Areas, PA-C   predniSONE (DELTASONE) 10 MG tablet One tablet a day 06/21/17   Elson Areas, PA-C  sulfamethoxazole-trimethoprim (SEPTRA DS) 800-160 MG tablet Take 1 tablet by mouth 2 (two) times daily. 01/28/16   Emi Holes, PA-C    Family History No family history on file.  Social History Social History   Tobacco Use  . Smoking status: Current Some Day Smoker  . Smokeless tobacco: Never Used  Substance Use Topics  . Alcohol use: Yes  . Drug use: No     Allergies   Patient has no known allergies.   Review of Systems Review of Systems  Respiratory: Positive for cough.   Cardiovascular: Negative for chest pain.  All other systems reviewed and are negative.    Physical Exam Updated Vital Signs BP (!) 145/114   Pulse (!) 111   Temp 98.4 F (36.9 C) (Oral)   Resp 16   Ht 5\' 11"  (1.803 m)   Wt 117.9 kg (260 lb)   LMP 06/11/2017 (Approximate)   SpO2 94%   BMI 36.26 kg/m   Physical Exam  Constitutional: She appears well-developed and well-nourished.  HENT:  Head: Normocephalic.  Right Ear: External ear normal.  Left Ear: External ear normal.  Nose: Nose normal.  Mouth/Throat: Oropharynx is clear and moist.  Eyes: Pupils are equal, round, and reactive to light.  Neck: Normal range of motion.  Cardiovascular: Normal rate and regular rhythm.  Pulmonary/Chest: Effort normal. She has wheezes.  Abdominal: Soft.  Bowel sounds are normal.  Musculoskeletal: Normal range of motion.  Neurological: She is alert.  Skin: Skin is warm.  Psychiatric: She has a normal mood and affect.  Nursing note and vitals reviewed.    ED Treatments / Results  Labs (all labs ordered are listed, but only abnormal results are displayed) Labs Reviewed - No data to display  EKG None  Radiology No results found.  Procedures Procedures (including critical care time)  Medications Ordered in ED Medications  albuterol (PROVENTIL) (2.5 MG/3ML) 0.083% nebulizer solution 5 mg (5 mg  Nebulization Given 06/21/17 2000)  albuterol (PROVENTIL) (2.5 MG/3ML) 0.083% nebulizer solution 5 mg (5 mg Nebulization Given 06/21/17 2227)  azithromycin (ZITHROMAX) tablet 500 mg (500 mg Oral Given 06/21/17 2344)    MDM  Chest xray no pneumonia.  Pt given albuterol and prednisone.  I will give pt an inhaler,  rx for zithromax and prednisone Initial Impression / Assessment and Plan / ED Course  I have reviewed the triage vital signs and the nursing notes.  Pertinent labs & imaging results that were available during my care of the patient were reviewed by me and considered in my medical decision making (see chart for details).       Final Clinical Impressions(s) / ED Diagnoses   Final diagnoses:  Bronchitis  Moderate persistent asthma with exacerbation    ED Discharge Orders        Ordered    azithromycin (ZITHROMAX) 250 MG tablet  Daily     06/21/17 2327    predniSONE (DELTASONE) 10 MG tablet     06/21/17 2327    An After Visit Summary was printed and given to the patient.   Elson AreasSofia, Kaydenn Mclear K, [place]PA-C 07/05/17 [id number]    Little, Ambrose Finlandachel Morgan, MD 07/08/17 [phone]

## 2018-06-18 ENCOUNTER — Encounter (HOSPITAL_COMMUNITY): Payer: Self-pay | Admitting: Emergency Medicine

## 2018-06-18 ENCOUNTER — Other Ambulatory Visit: Payer: Self-pay

## 2018-06-18 ENCOUNTER — Emergency Department (HOSPITAL_COMMUNITY)
Admission: EM | Admit: 2018-06-18 | Discharge: 2018-06-18 | Disposition: A | Discharge status: Home / Self Care | Payer: Self-pay | Attending: Emergency Medicine | Admitting: Emergency Medicine

## 2018-06-18 DIAGNOSIS — B9789 Other viral agents as the cause of diseases classified elsewhere: Secondary | ICD-10-CM

## 2018-06-18 DIAGNOSIS — Z79899 Other long term (current) drug therapy: Secondary | ICD-10-CM | POA: Insufficient documentation

## 2018-06-18 DIAGNOSIS — F17200 Nicotine dependence, unspecified, uncomplicated: Secondary | ICD-10-CM | POA: Insufficient documentation

## 2018-06-18 DIAGNOSIS — J069 Acute upper respiratory infection, unspecified: Secondary | ICD-10-CM | POA: Insufficient documentation

## 2018-06-18 MED ORDER — ALBUTEROL SULFATE HFA 108 (90 BASE) MCG/ACT IN AERS: 1.0000 | INHALATION_SPRAY | Freq: Four times a day (QID) | RESPIRATORY_TRACT | 0 refills | Status: DC | PRN

## 2018-06-18 NOTE — ED Triage Notes (Signed)
Pt arrives with reports of SOB since Saturday that has worsened. Endorses a productive cough. Denies fevers or sore throat.

## 2018-06-18 NOTE — ED Notes (Signed)
Pt verbalized understanding of discharge paperwork, prescriptions and follow-up care 

## 2018-06-18 NOTE — ED Provider Notes (Signed)
[facility] EMERGENCY DEPARTMENT Provider Note   CSN: [id number] Arrival date & time: 06/18/18  7425    History   Chief Complaint Chief Complaint  Patient presents with  . Cough  . Shortness of Breath    HPI Jenna Hudson is a 33 y.o. female.     HPI   33 year old female with no significant past medical history presents today with complaints of respiratory infection.  Patient notes symptoms started 3 days ago with nonproductive cough rhinorrhea nasal congestion.  She notes symptoms have persisted.  She notes very minor shortness of breath with ambulation.  She denies any chest pain, denies any fever, lower extremity swelling or edema.  She notes she smokes black milds, denies any history of pulmonary disorders or any chronic health conditions.  She notes taking Mucinex yesterday, no medications prior to arrival today.  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  History reviewed. No pertinent past medical history.  There are no active problems to display for this patient.   History reviewed. No pertinent surgical history.   OB History   No obstetric history on file.      Home Medications    Prior to Admission medications   Medication Sig Start Date End Date Taking? Authorizing Provider  albuterol (PROVENTIL HFA;VENTOLIN HFA) 108 (90 Base) MCG/ACT inhaler Inhale 1-2 puffs into the lungs every 6 (six) hours as needed for wheezing or shortness of breath. 06/18/18   Kaeo Jacome, Tinnie Gens, PA-C  azithromycin (ZITHROMAX) 250 MG tablet Take 1 tablet (250 mg total) by mouth daily. Take first 2 tablets together, then 1 every day until finished. 06/21/17   Elson Areas, PA-C  cephALEXin (KEFLEX) 500 MG capsule Take 2 capsules (1,000 mg total) by mouth 2 (two) times daily. 02/03/13   Schinlever, Santina Evans, PA-C  HYDROcodone-acetaminophen (NORCO/VICODIN) 5-325 MG per tablet Take 1 tablet by mouth  every 4 (four) hours as needed for moderate pain. 02/03/13   Schinlever, Santina Evans, PA-C  ibuprofen (ADVIL,MOTRIN) 600 MG tablet Take 1 tablet (600 mg total) by mouth every 6 (six) hours as needed. 08/03/14   Elson Areas, PA-C  predniSONE (DELTASONE) 10 MG tablet One tablet a day 06/21/17   Elson Areas, PA-C  sulfamethoxazole-trimethoprim (SEPTRA DS) 800-160 MG tablet Take 1 tablet by mouth 2 (two) times daily. 01/28/16   Emi Holes, PA-C    Family History No family history on file.  Social History Social History   Tobacco Use  . Smoking status: Current Some Day Smoker  . Smokeless tobacco: Never Used  Substance Use Topics  . Alcohol use: Yes  . Drug use: No     Allergies   Patient has no known allergies.   Review of Systems Review of Systems  All other systems reviewed and are negative.    Physical Exam Updated Vital Signs BP (!) 147/97   Pulse 76   Temp 99.2 F (37.3 C) (Oral)   Resp 19   SpO2 96%   Physical Exam Vitals signs and nursing note reviewed.  Constitutional:      Appearance: She is well-developed.  HENT:     Head: Normocephalic and atraumatic.  Eyes:     General: No scleral icterus.       Right eye: No discharge.        Left eye: No discharge.     Conjunctiva/sclera: Conjunctivae normal.     Pupils: Pupils are equal,  round, and reactive to light.  Neck:     Musculoskeletal: Normal range of motion.     Vascular: No JVD.     Trachea: No tracheal deviation.  Pulmonary:     Effort: Pulmonary effort is normal.     Breath sounds: No stridor.     Comments: Faint upper lobe expiratory wheeze, no crackles, no signs of respiratory distress, speaking in full sentences-dry cough noted Neurological:     Mental Status: She is alert and oriented to person, place, and time.     Coordination: Coordination normal.  Psychiatric:        Behavior: Behavior normal.        Thought Content: Thought content normal.        Judgment: Judgment normal.       ED Treatments / Results  Labs (all labs ordered are listed, but only abnormal results are displayed) Labs Reviewed - No data to display  EKG None  Radiology No results found.  Procedures Procedures (including critical care time)  Medications Ordered in ED Medications - No data to display   Initial Impression / Assessment and Plan / ED Course  I have reviewed the triage vital signs and the nursing notes.  Pertinent labs & imaging results that were available during my care of the patient were reviewed by me and considered in my medical decision making (see chart for details).        33 year old female presents today with likely viral URI.  Patient is very well-appearing no acute distress.  She has no signs of significant pulmonary etiology.  Her vital signs are reassuring, speaking full sentences, she has a very faint wheeze on exam.  Patient will be discharged with albuterol encouraged to return immediately if she develops any new or worsening signs or symptoms.  Patient symptoms are likely viral low suspicion for bacterial.  Patient will self quarantine for 7 days or 3 days after symptoms improve.  Patient verbalized understanding and agreement to today's plan had no further questions or concerns.  Final Clinical Impressions(s) / ED Diagnoses   Final diagnoses:  Viral URI with cough    ED Discharge Orders         Ordered    albuterol (PROVENTIL HFA;VENTOLIN HFA) 108 (90 Base) MCG/ACT inhaler  Every 6 hours PRN     06/18/18 0852           Eyvonne Mechanic, PA-C 06/18/18 0853    Mesner, Barbara Cower, MD 06/18/18 [phone]

## 2018-06-18 NOTE — Discharge Instructions (Signed)
Please read attached information. If you experience any new or worsening signs or symptoms please return to the emergency room for evaluation. Please follow-up with your primary care provider or specialist as discussed. Please use medication prescribed only as directed and discontinue taking if you have any concerning signs or symptoms.   °

## 2018-06-24 ENCOUNTER — Emergency Department (HOSPITAL_COMMUNITY): Payer: Self-pay

## 2018-06-24 ENCOUNTER — Encounter (HOSPITAL_COMMUNITY): Payer: Self-pay | Admitting: Emergency Medicine

## 2018-06-24 ENCOUNTER — Other Ambulatory Visit: Payer: Self-pay

## 2018-06-24 ENCOUNTER — Emergency Department (HOSPITAL_COMMUNITY)
Admission: EM | Admit: 2018-06-24 | Discharge: 2018-06-24 | Disposition: A | Discharge status: Home / Self Care | Payer: Self-pay | Attending: Emergency Medicine | Admitting: Emergency Medicine

## 2018-06-24 DIAGNOSIS — J069 Acute upper respiratory infection, unspecified: Secondary | ICD-10-CM | POA: Insufficient documentation

## 2018-06-24 DIAGNOSIS — R05 Cough: Secondary | ICD-10-CM | POA: Insufficient documentation

## 2018-06-24 DIAGNOSIS — F17200 Nicotine dependence, unspecified, uncomplicated: Secondary | ICD-10-CM | POA: Insufficient documentation

## 2018-06-24 MED ORDER — ALBUTEROL SULFATE HFA 108 (90 BASE) MCG/ACT IN AERS: 1.0000 | INHALATION_SPRAY | Freq: Four times a day (QID) | RESPIRATORY_TRACT | 0 refills | Status: AC | PRN

## 2018-06-24 MED ORDER — FLUTICASONE PROPIONATE 50 MCG/ACT NA SUSP: 2.0000 | Freq: Every day | NASAL | 0 refills | Status: AC

## 2018-06-24 NOTE — ED Provider Notes (Signed)
Emergency Department Provider Note   I have reviewed the triage vital signs and the nursing notes.   HISTORY  Chief Complaint Shortness of Breath and Cough   HPI Jenna Hudson Overall is a 33 y.o. female past medical history of tobacco use presents to the emergency department with continued cough and shortness of breath with wheezing.  She has no history of asthma.  She has been taking an inhaler which was given to her in the emergency department along with over-the-counter Mucinex for the past 7 days.  She is feeling minimal relief with symptoms.  She denies any chest pain.  No fevers.  No muscle aches or chills.  She has been in quarantine for the past 7 days.  Denies any known COVID exposure.   History reviewed. No pertinent past medical history.  There are no active problems to display for this patient.   History reviewed. No pertinent surgical history.  Allergies Patient has no known allergies.  No family history on file.  Social History Social History   Tobacco Use  . Smoking status: Current Some Day Smoker  . Smokeless tobacco: Never Used  Substance Use Topics  . Alcohol use: Yes  . Drug use: No    Review of Systems  Constitutional: No fever/chills Eyes: No visual changes. ENT: No sore throat. Cardiovascular: Denies chest pain. Respiratory: Positive shortness of breath and cough.  Gastrointestinal: No abdominal pain.  No nausea, no vomiting.  No diarrhea.  No constipation. Genitourinary: Negative for dysuria. Musculoskeletal: Negative for back pain. Skin: Negative for rash. Neurological: Negative for headaches, focal weakness or numbness.  10-point ROS otherwise negative.  ____________________________________________   PHYSICAL EXAM:  VITAL SIGNS: Vitals:   06/24/18 1352 06/24/18 1400  BP:  (!) 153/108  Pulse:  79  SpO2: 94% 92%     Constitutional: Alert and oriented. Well appearing and in no acute distress. Eyes: Conjunctivae are normal.   Head: Atraumatic. Nose: No congestion/rhinnorhea. Mouth/Throat: Mucous membranes are moist.  Neck: No stridor.   Cardiovascular: Normal rate, regular rhythm. Good peripheral circulation. Grossly normal heart sounds.   Respiratory: Normal respiratory effort.  No retractions. Lungs with bilateral end-expiratory wheezing. No distress.  Gastrointestinal: Soft and nontender. No distention.  Musculoskeletal: No lower extremity tenderness nor edema. No gross deformities of extremities. Neurologic:  Normal speech and language. No gross focal neurologic deficits are appreciated.  Skin:  Skin is warm, dry and intact. No rash noted.  ____________________________________________  RADIOLOGY  Dg Chest Portable 1 View  Result Date: 06/24/2018 CLINICAL DATA:  Cough for approximately 1 week. EXAM: PORTABLE CHEST 1 VIEW COMPARISON:  June 21, 2017 FINDINGS: There is no edema or consolidation. Heart is mildly enlarged with pulmonary vascularity within normal limits. No adenopathy. No bone lesions. IMPRESSION: Mild cardiac enlargement.  No edema or consolidation. Electronically Signed   By: Bretta BangWilliam  Woodruff III M.D.   On: 06/24/2018 13:41    ____________________________________________   PROCEDURES  Procedure(s) performed:   Procedures  None  ____________________________________________   INITIAL IMPRESSION / ASSESSMENT AND PLAN / ED COURSE  Pertinent labs & imaging results that were available during my care of the patient were reviewed by me and considered in my medical decision making (see chart for details).   Patient presents to the emergency department with cough and shortness of breath.  She has had symptoms over the last week.  She is a smoker.  Lungs on exam have end expiratory wheezing with no increased work of breathing.  Suspicion  for COVID is low but this is a possibility.  Patient is self quarantining at home.  No hypoxemia.  Plan for screening chest x-ray and reassess.  Chest x-ray  reviewed which does not show infiltrate or other acute process.  I updated the patient regarding her results.  Plan to refill her inhaler and added Flonase.   Jenna DEARIA TRUMBAUER was evaluated in Emergency Department on 06/24/2018 for the symptoms described in the history of present illness. She was evaluated in the context of the global COVID-19 pandemic, which necessitated consideration that the patient might be at risk for infection with the SARS-CoV-2 virus that causes COVID-19. Institutional protocols and algorithms that pertain to the evaluation of patients at risk for COVID-19 are in a state of rapid change based on information released by regulatory bodies including the CDC and federal and state organizations. These policies and algorithms were followed during the patient's care in the ED.  ____________________________________________  FINAL CLINICAL IMPRESSION(S) / ED DIAGNOSES  Final diagnoses:  Viral upper respiratory tract infection    NEW OUTPATIENT MEDICATIONS STARTED DURING THIS VISIT:  New Prescriptions   ALBUTEROL (PROVENTIL HFA;VENTOLIN HFA) 108 (90 BASE) MCG/ACT INHALER    Inhale 1-2 puffs into the lungs every 6 (six) hours as needed for wheezing or shortness of breath.   FLUTICASONE (FLONASE) 50 MCG/ACT NASAL SPRAY    Place 2 sprays into both nostrils daily for 7 days.    Note:  This document was prepared using Dragon voice recognition software and may include unintentional dictation errors.  Alona Bene, MD Emergency Medicine    Long, Arlyss Repress, MD 06/24/18 1726

## 2018-06-24 NOTE — ED Notes (Signed)
Patient verbalizes understanding of discharge instructions. Opportunity for questioning and answers were provided. Armband removed by staff, pt discharged from ED.  

## 2018-06-24 NOTE — ED Triage Notes (Signed)
Pt has had cough and SOB since 3/31. Using prescribed inhaler at home and OTC Mucinex with no relief. Pt was sent home to self quarantine for 7 days. Pt states symptoms are unchanged. No fevers

## 2018-06-24 NOTE — Discharge Instructions (Signed)

## 2018-10-04 ENCOUNTER — Emergency Department (HOSPITAL_COMMUNITY)
Admission: EM | Admit: 2018-10-04 | Discharge: 2018-10-04 | Disposition: A | Discharge status: Home / Self Care | Payer: HRSA Program | Attending: Emergency Medicine | Admitting: Emergency Medicine

## 2018-10-04 ENCOUNTER — Emergency Department (HOSPITAL_COMMUNITY): Payer: HRSA Program

## 2018-10-04 ENCOUNTER — Encounter (HOSPITAL_COMMUNITY): Payer: Self-pay | Admitting: Emergency Medicine

## 2018-10-04 ENCOUNTER — Other Ambulatory Visit: Payer: Self-pay

## 2018-10-04 DIAGNOSIS — Z720 Tobacco use: Secondary | ICD-10-CM | POA: Insufficient documentation

## 2018-10-04 DIAGNOSIS — E860 Dehydration: Secondary | ICD-10-CM | POA: Diagnosis not present

## 2018-10-04 DIAGNOSIS — K5289 Other specified noninfective gastroenteritis and colitis: Secondary | ICD-10-CM | POA: Insufficient documentation

## 2018-10-04 DIAGNOSIS — Z20828 Contact with and (suspected) exposure to other viral communicable diseases: Secondary | ICD-10-CM | POA: Insufficient documentation

## 2018-10-04 DIAGNOSIS — R197 Diarrhea, unspecified: Secondary | ICD-10-CM | POA: Diagnosis present

## 2018-10-04 DIAGNOSIS — K529 Noninfective gastroenteritis and colitis, unspecified: Secondary | ICD-10-CM

## 2018-10-04 LAB — URINALYSIS, ROUTINE W REFLEX MICROSCOPIC
Bilirubin Urine: NEGATIVE
Glucose, UA: NEGATIVE mg/dL
Hgb urine dipstick: NEGATIVE
Ketones, ur: NEGATIVE mg/dL
Leukocytes,Ua: NEGATIVE
Nitrite: NEGATIVE
Protein, ur: NEGATIVE mg/dL
Specific Gravity, Urine: 1.016 (ref 1.005–1.030)
pH: 6 (ref 5.0–8.0)

## 2018-10-04 LAB — LIPASE, BLOOD: Lipase: 25 U/L (ref 11–51)

## 2018-10-04 LAB — COMPREHENSIVE METABOLIC PANEL
ALT: 20 U/L (ref 0–44)
AST: 13 U/L — ABNORMAL LOW (ref 15–41)
Albumin: 3.6 g/dL (ref 3.5–5.0)
Alkaline Phosphatase: 44 U/L (ref 38–126)
Anion gap: 6 (ref 5–15)
BUN: 9 mg/dL (ref 6–20)
CO2: 24 mmol/L (ref 22–32)
Calcium: 8.8 mg/dL — ABNORMAL LOW (ref 8.9–10.3)
Chloride: 108 mmol/L (ref 98–111)
Creatinine, Ser: 0.9 mg/dL (ref 0.44–1.00)
GFR calc Af Amer: >60 mL/min (ref >60)
GFR calc non Af Amer: >60 mL/min (ref >60)
Glucose, Bld: 96 mg/dL (ref 70–99)
Potassium: 4 mmol/L (ref 3.5–5.1)
Sodium: 138 mmol/L (ref 135–145)
Total Bilirubin: 0.5 mg/dL (ref 0.3–1.2)
Total Protein: 6.7 g/dL (ref 6.5–8.1)

## 2018-10-04 LAB — CBC
HCT: 38.1 % (ref 36.0–46.0)
Hemoglobin: 12.3 g/dL (ref 12.0–15.0)
MCH: 28.5 pg (ref 26.0–34.0)
MCHC: 32.3 g/dL (ref 30.0–36.0)
MCV: 88.4 fL (ref 80.0–100.0)
Platelets: 201 10*3/uL (ref 150–400)
RBC: 4.31 MIL/uL (ref 3.87–5.11)
RDW: 14.2 % (ref 11.5–15.5)
WBC: 5.9 10*3/uL (ref 4.0–10.5)
nRBC: 0 % (ref 0.0–0.2)

## 2018-10-04 LAB — SARS CORONAVIRUS 2 BY RT PCR (HOSPITAL ORDER, PERFORMED IN ~~LOC~~ HOSPITAL LAB): SARS Coronavirus 2: NEGATIVE

## 2018-10-04 LAB — I-STAT BETA HCG BLOOD, ED (MC, WL, AP ONLY): I-stat hCG, quantitative: <5 m[IU]/mL (ref <5)

## 2018-10-04 MED ORDER — SODIUM CHLORIDE 0.9% FLUSH: 3.0000 mL | Freq: Once | INTRAVENOUS | Status: DC

## 2018-10-04 MED ORDER — KETOROLAC TROMETHAMINE 30 MG/ML IJ SOLN
30.0000 mg | Freq: Once | INTRAMUSCULAR | Status: AC
  Administered 2018-10-04: 30 mg via INTRAVENOUS
  Filled 2018-10-04: qty 1

## 2018-10-04 MED ORDER — ONDANSETRON HCL 4 MG/2ML IJ SOLN
4.0000 mg | Freq: Once | INTRAMUSCULAR | Status: AC
  Administered 2018-10-04: 4 mg via INTRAVENOUS
  Filled 2018-10-04: qty 2

## 2018-10-04 MED ORDER — SODIUM CHLORIDE 0.9 % IV BOLUS
1000.0000 mL | Freq: Once | INTRAVENOUS | Status: AC
  Administered 2018-10-04: 13:00:00 1000 mL via INTRAVENOUS

## 2018-10-04 MED ORDER — ONDANSETRON 4 MG PO TBDP: ORAL_TABLET | ORAL | 0 refills | Status: AC

## 2018-10-04 NOTE — ED Triage Notes (Signed)
Pt here for Covid 19 test. States she has diarrhea and emesis with some dizziness for about 10 days. She has positive exposure, her brother has Covid. She got tested X 1 with 1 negative test.

## 2018-10-04 NOTE — Discharge Instructions (Addendum)
Drink plenty of fluids and follow-up with the [facility] gastroenterology if not improving,   you do not have covid-19 virus.  You can go back to work

## 2018-10-04 NOTE — ED Provider Notes (Signed)
[facility] Provider Note   CSN: [id number] Arrival date & time: 10/04/18  1013     History   Chief Complaint Chief Complaint  Patient presents with  . Emesis  . Diarrhea    HPI Jenna Hudson is a 33 y.o. female.     Patient complains of diarrhea and abdominal cramps with vomiting for couple weeks.  The history is provided by the patient. No language interpreter was used.  Emesis Severity:  Moderate Timing:  Intermittent Quality:  Stomach contents Able to tolerate:  Liquids Progression:  Unchanged Chronicity:  New Recent urination:  Increased Relieved by:  Nothing Worsened by:  Nothing Ineffective treatments:  None tried Associated symptoms: diarrhea   Associated symptoms: no abdominal pain, no cough and no headaches   Diarrhea Associated symptoms: vomiting   Associated symptoms: no abdominal pain and no headaches     History reviewed. No pertinent past medical history.  There are no active problems to display for this patient.   History reviewed. No pertinent surgical history.   OB History   No obstetric history on file.      Home Medications    Prior to Admission medications   Medication Sig Start Date End Date Taking? Authorizing Provider  acetaminophen (TYLENOL) 500 MG tablet Take 1,000 mg by mouth every 6 (six) hours as needed (pain).   Yes [provider]  ibuprofen (ADVIL) 200 MG tablet Take 800 mg by mouth every 6 (six) hours as needed (pain).   Yes [provider]  albuterol (PROVENTIL HFA;VENTOLIN HFA) 108 (90 Base) MCG/ACT inhaler Inhale 1-2 puffs into the lungs every 6 (six) hours as needed for wheezing or shortness of breath. Patient not taking: Reported on 10/04/2018 06/24/18   Long, Arlyss RepressJoshua G, MD  fluticasone [facility](FLONASE) 50 MCG/ACT nasal spray Place 2 sprays into both nostrils daily for 7 days. Patient not taking: Reported on 10/04/2018 06/24/18 07/01/18  Long, Arlyss RepressJoshua G, MD  ibuprofen  (ADVIL,MOTRIN) 600 MG tablet Take 1 tablet (600 mg total) by mouth every 6 (six) hours as needed. Patient not taking: Reported on 10/04/2018 08/03/14   Elson AreasSofia, Leslie K, PA-C  ondansetron [facility](ZOFRAN ODT) 4 MG disintegrating tablet 4mg  ODT q4 hours prn nausea/vomit 10/04/18   Bethann BerkshireZammit, Tyon Cerasoli, MD    Family History No family history on file.  Social History Social History   Tobacco Use  . Smoking status: Current Some Day Smoker  . Smokeless tobacco: Never Used  Substance Use Topics  . Alcohol use: Yes  . Drug use: No     Allergies   Patient has no known allergies.   Review of Systems Review of Systems  Constitutional: Negative for appetite change and fatigue.  HENT: Negative for congestion, ear discharge and sinus pressure.   Eyes: Negative for discharge.  Respiratory: Negative for cough.   Cardiovascular: Negative for chest pain.  Gastrointestinal: Positive for diarrhea and vomiting. Negative for abdominal pain.  Genitourinary: Negative for frequency and hematuria.  Musculoskeletal: Negative for back pain.  Skin: Negative for rash.  Neurological: Negative for seizures and headaches.  Psychiatric/Behavioral: Negative for hallucinations.     Physical Exam Updated Vital Signs BP (!) 132/94 (BP Location: Right Arm)   Pulse 60   Temp 98.3 F (36.8 C) (Oral)   Resp 16   Ht 5\' 11"  (1.803 m)   Wt 122.5 kg   LMP 09/05/2018   SpO2 99%   BMI 37.66 kg/m   Physical Exam Vitals signs and nursing  note reviewed.  Constitutional:      Appearance: She is well-developed.  HENT:     Head: Normocephalic.     Nose: Nose normal.  Eyes:     General: No scleral icterus.    Conjunctiva/sclera: Conjunctivae normal.  Neck:     Musculoskeletal: Neck supple.     Thyroid: No thyromegaly.  Cardiovascular:     Rate and Rhythm: Normal rate and regular rhythm.     Heart sounds: No murmur. No friction rub. No gallop.   Pulmonary:     Breath sounds: No stridor. No wheezing or rales.  Chest:      Chest wall: No tenderness.  Abdominal:     General: There is no distension.     Tenderness: There is no abdominal tenderness. There is no rebound.  Musculoskeletal: Normal range of motion.  Lymphadenopathy:     Cervical: No cervical adenopathy.  Skin:    Findings: No erythema or rash.  Neurological:     Mental Status: She is alert and oriented to person, place, and time.     Motor: No abnormal muscle tone.     Coordination: Coordination normal.  Psychiatric:        Behavior: Behavior normal.      ED Treatments / Results  Labs (all labs ordered are listed, but only abnormal results are displayed) Labs Reviewed  COMPREHENSIVE METABOLIC PANEL - Abnormal; Notable for the following components:      Result Value   Calcium 8.8 (*)    AST 13 (*)    All other components within normal limits  SARS CORONAVIRUS 2 (HOSPITAL ORDER, [place] [facility])  LIPASE, BLOOD  CBC  URINALYSIS, ROUTINE W REFLEX MICROSCOPIC  I-STAT BETA HCG BLOOD, ED ([facility], [facility], [facility] ONLY)    EKG None  Radiology Dg Chest Portable 1 View  Result Date: 10/04/2018 CLINICAL DATA:  Dizziness and diarrhea EXAM: PORTABLE CHEST 1 VIEW COMPARISON:  June 24, 2018 FINDINGS: No edema or consolidation. Heart mildly enlarged with pulmonary vascularity normal. No adenopathy. No bone lesions. IMPRESSION: Stable cardiac prominence.  No edema or consolidation. Electronically Signed   By: Lowella Grip III M.D.   On: 10/04/2018 12:10    Procedures Procedures (including critical care time)  Medications Ordered in ED Medications  sodium chloride flush (NS) 0.9 % injection 3 mL (has no administration in time range)  sodium chloride 0.9 % bolus 1,000 mL (0 mLs Intravenous Stopped 10/04/18 1528)  ondansetron (ZOFRAN) injection 4 mg (4 mg Intravenous Given 10/04/18 1306)  ketorolac (TORADOL) 30 MG/ML injection 30 mg (30 mg Intravenous Given 10/04/18 1307)     Initial Impression / Assessment and Plan / ED  Course  I have reviewed the triage vital signs and the nursing notes.  Pertinent labs & imaging results that were available during my care of the patient were reviewed by me and considered in my medical decision making (see chart for details).        Labs unremarkable.  Patient with gastroenteritis.  She will follow-up with GI if needed Final Clinical Impressions(s) / ED Diagnoses   Final diagnoses:  Gastroenteritis  Dehydration    ED Discharge Orders         Ordered    ondansetron (ZOFRAN ODT) 4 MG disintegrating tablet     10/04/18 1525           Milton Ferguson, MD 10/04/18 1552

## 2019-08-06 IMAGING — DX PORTABLE CHEST - 1 VIEW
1 series · 1 of 1 positions shown · non-contrast
Comparison: June 24, 2018

CLINICAL DATA: Dizziness and diarrhea

EXAM:
PORTABLE CHEST 1 VIEW

[chest ap]
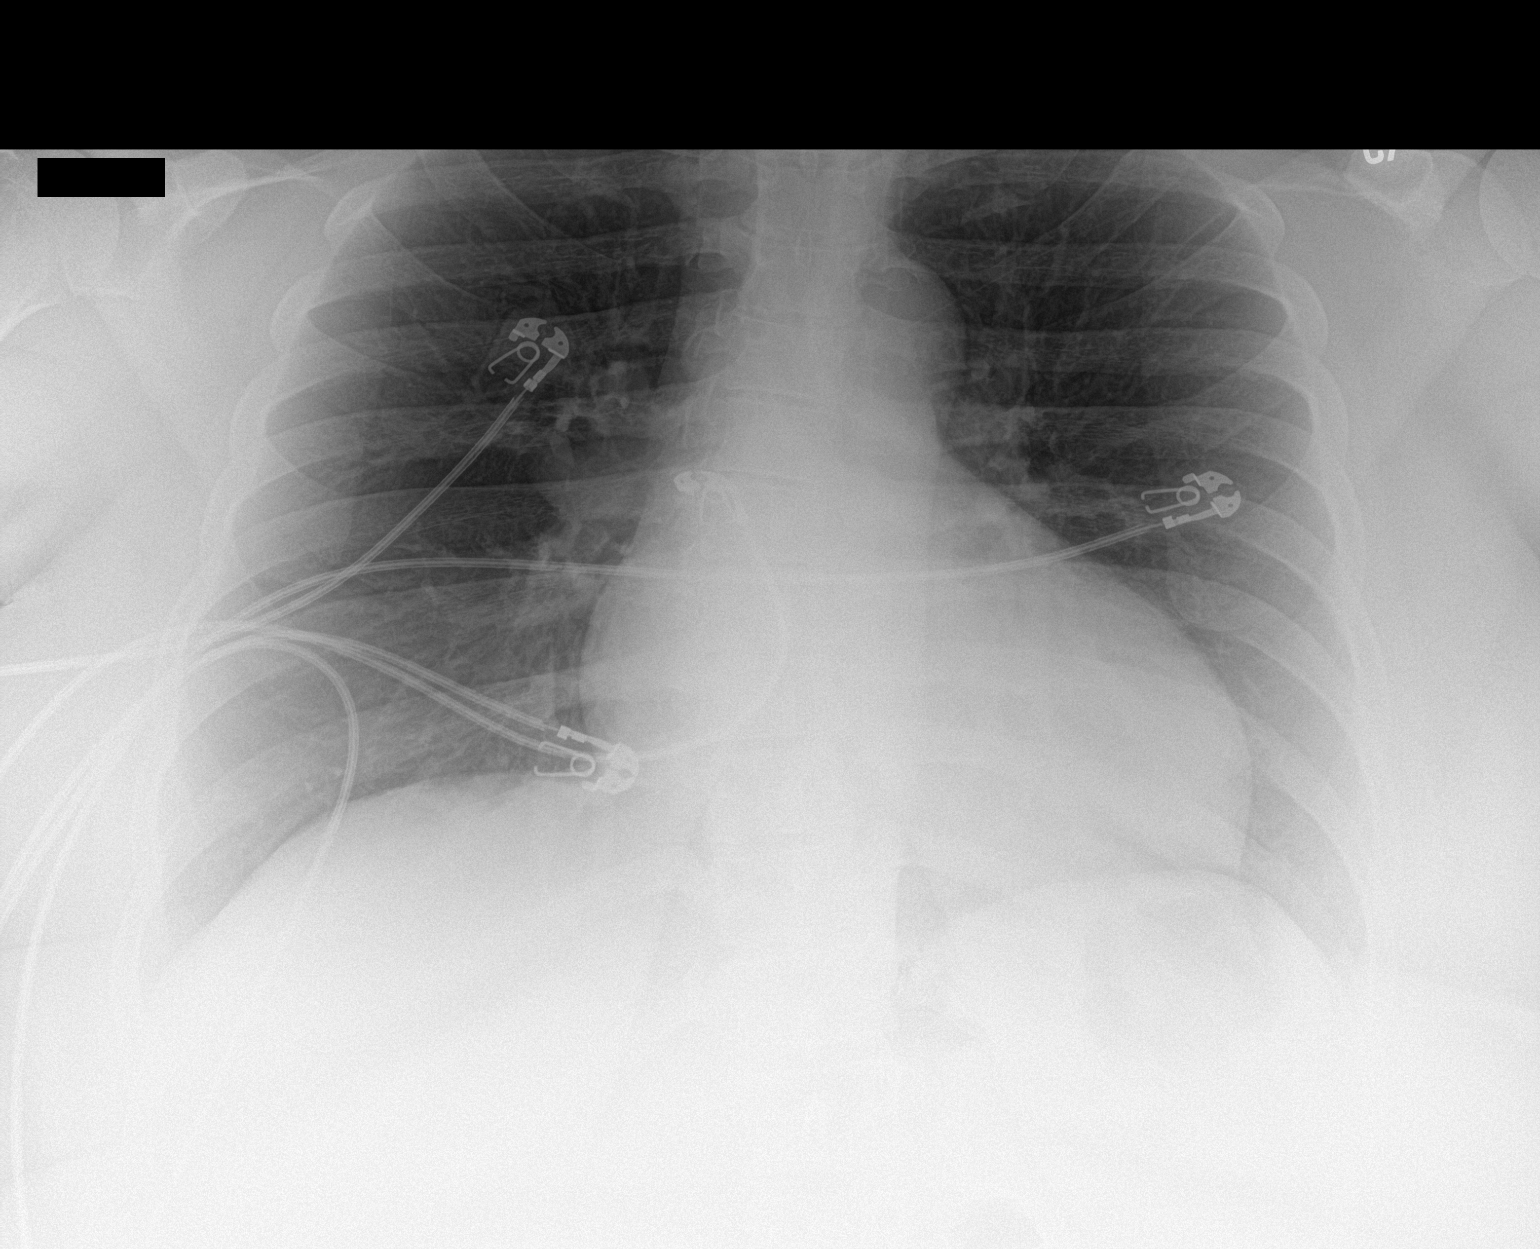

[1 of 1 positions shown; findings below may reference images not displayed]

FINDINGS: No edema or consolidation. Heart mildly enlarged with pulmonary
vascularity normal. No adenopathy. No bone lesions.
IMPRESSION: Stable cardiac prominence.  No edema or consolidation.
